# Patient Record
Sex: Male | Born: 1965 | Race: White | Hispanic: No | Marital: Single | Smoking: Never smoker
Health system: Southern US, Community
[De-identification: ages and names within clinical notes are randomized; demographics above are authoritative.]

---

## 1998-02-20 ENCOUNTER — Ambulatory Visit (HOSPITAL_BASED_OUTPATIENT_CLINIC_OR_DEPARTMENT_OTHER): Admission: RE | Admit: 1998-02-20 | Discharge: 1998-02-20 | Payer: Self-pay | Admitting: Otolaryngology

## 1999-01-02 ENCOUNTER — Other Ambulatory Visit: Admission: RE | Admit: 1999-01-02 | Discharge: 1999-01-02 | Payer: Self-pay | Admitting: Otolaryngology

## 2001-09-14 ENCOUNTER — Encounter: Payer: Self-pay | Admitting: Orthopedic Surgery

## 2001-09-14 ENCOUNTER — Ambulatory Visit: Admission: RE | Admit: 2001-09-14 | Discharge: 2001-09-14 | Payer: Self-pay | Admitting: Orthopedic Surgery

## 2001-12-19 ENCOUNTER — Encounter: Admission: RE | Admit: 2001-12-19 | Discharge: 2001-12-19 | Payer: Self-pay | Admitting: Internal Medicine

## 2001-12-19 ENCOUNTER — Encounter: Payer: Self-pay | Admitting: Internal Medicine

## 2002-04-25 ENCOUNTER — Encounter (INDEPENDENT_AMBULATORY_CARE_PROVIDER_SITE_OTHER): Payer: Self-pay | Admitting: Specialist

## 2002-04-25 ENCOUNTER — Observation Stay (HOSPITAL_COMMUNITY): Admission: EM | Admit: 2002-04-25 | Discharge: 2002-04-25 | Payer: Self-pay | Admitting: Emergency Medicine

## 2002-07-25 ENCOUNTER — Ambulatory Visit (HOSPITAL_COMMUNITY): Admission: RE | Admit: 2002-07-25 | Discharge: 2002-07-25 | Payer: Self-pay | Admitting: *Deleted

## 2002-09-17 ENCOUNTER — Encounter: Payer: Self-pay | Admitting: Internal Medicine

## 2002-09-17 ENCOUNTER — Encounter: Admission: RE | Admit: 2002-09-17 | Discharge: 2002-09-17 | Payer: Self-pay | Admitting: Internal Medicine

## 2005-07-06 ENCOUNTER — Ambulatory Visit (HOSPITAL_COMMUNITY): Admission: RE | Admit: 2005-07-06 | Discharge: 2005-07-06 | Payer: Self-pay | Admitting: Cardiology

## 2007-08-15 ENCOUNTER — Encounter: Admission: RE | Admit: 2007-08-15 | Discharge: 2007-08-15 | Payer: Self-pay | Admitting: Cardiology

## 2011-02-12 NOTE — H&P (Signed)
Bradley Reilly, Bradley Reilly                      ACCOUNT NO.:  [id number]   MEDICAL RECORD NO.:  [id number]                   PATIENT TYPE:  INP   LOCATION:  0345                                 FACILITY:  [facility]   PHYSICIAN:  Althea Grimmer. Luther Parody, M.D.            DATE OF BIRTH:  Feb 03, 1966   DATE OF ADMISSION:  04/25/2002  DATE OF DISCHARGE:                                HISTORY & PHYSICAL   HISTORY:  The patient is a 45 year old male with a history of hepatitis C.  He was about to begin therapy with interferon and ribavirin later this week.  He presented earlier this morning to the emergency room having had several  episodes of hematemesis with syncope.  By his admission, he has been  drinking at least two drinks of hard liquor nightly and he has been under a  lot of stress.  He had an upset stomach and vomited bright red blood  yesterday and this morning.  He did not have any melena and he does not seem  to have any abdominal pain at this time.  He has never had a problem like  this before.  He is not taking nonsteroidals or aspirin.   CURRENT MEDICATIONS:  None.   ALLERGIES:  None.   PAST MEDICAL HISTORY:  Pertinent for multiple surgeries on nose and back  surgery with Harrington rod placement for a fractured T-11 vertebra.   FAMILY HISTORY:  Negative for ulcers, gallstones, inflammatory bowel  disease, colorectal neoplasia or liver problems.   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  He is single but lives with a male.  He does not smoke.  In general  he drinks two to five drinks per week but his girlfriend insists that it is  more.   REVIEW OF SYSTEMS:  No weight loss or night sweats.  ENDOCRINE:  No history  of diabetes or thyroid problems.  SKIN:  No rash or pruritus.  EYES:  No  icterus or change in vision.  ENT:  No aphthous ulcerations or chronic sore  throat.  RESPIRATORY:  No shortness of breath, coughing or wheezing.  CARDIAC:  No chest pain, complications or history of valvular heart disease.  GI:  As above.  GU:  No dysuria or hematuria.   PHYSICAL EXAMINATION:  GENERAL:  He is a well-developed muscular young adult  male in no acute distress.  Afebrile.  Blood pressure 130/88, pulse 90 and  regular.  SKIN:  Normal.  HEENT:  Eyes are anicteric.  Oropharynx is unremarkable without aphthous  ulcerations.  No significant cervical adenopathy.  Neck supple without  thyromegaly.  CHEST:  Clear to auscultation and percussion.  ABDOMEN:  Muscular without mass, tenderness or organomegaly.  No  hepatomegaly.  EXTREMITIES:  Without cyanosis, clubbing edema or rash.  GU:  No inguinal adenopathy.   LABORATORY DATA:  Hemoglobin 11.7, which has fallen  from his baseline of  13.5.  Electrolytes are normal.  Amylase and lipase normal.   IMPRESSION:  Acute upper gastrointestinal bleeding.  Rule out peptic  disease, ulcer or Mallory-Weiss tear.  I do not think he has significant  enough liver disease to develop varices.   PLAN:  Urgent endoscopy is scheduled.  Until this is accomplished, he will  be on Protonix drip and kept NPO.  Please see the orders.   ADDENDUM:  Upper endoscopy revealed esophagitis with suggestion of Barrett's  that was biopsied.  In addition, he had a nonbleeding Mallory-Weiss tear and  no other findings.  Since he has had no vomiting since this morning and his  hemoglobin is 11.7, he will be discharged home for evaluation in the office.                                                Althea Grimmer. Luther Parody, M.D.    PJS/MEDQ  D:  04/25/2002  T:  04/30/2002  Job:  [id number]

## 2011-02-12 NOTE — Op Note (Signed)
[facility]  Patient:    Bradley Reilly, Bradley Reilly Visit Number: [id number] MRN: [id number]          Service Type: SUR Location: 4W [id number] 01 Attending Physician:  Dominica Severin Dictated by:   Elisha Ponder, M.D. Proc. Date: 09/15/01 Admit Date:  09/14/2001 Discharge Date: 09/14/2001   CC:         Elisha Ponder, M.D.   Operative Report  DATE OF BIRTH:  27-Oct-1965.  PREOPERATIVE DIAGNOSIS:  Right distal biceps tendon complete rupture.  POSTOPERATIVE DIAGNOSIS:  Right distal biceps tendon complete rupture.  PROCEDURE: 1. Right distal biceps reattachment at the radial tuberosity with Endobutton    technique. 2. Stress radiography, right elbow.  SURGEON:  Elisha Ponder, M.D.  ASSISTANT:  Ottie Glazier. Wynona Neat, P.A.-C.  COMPLICATIONS:  None.  ANESTHESIA:  General.  TOURNIQUET TIME:  Less than two hours.  ESTIMATED BLOOD LOSS:  Minimal.  DRAINS:  None.  INDICATIONS:  This patient is a 45 year old white male who presents with a distal biceps rupture. This was sustained approximately two weeks ago. The patient has significant supination and flexion strength loss. I have discussed with him the diagnosis and its treatment. I have discussed with him that typically supination endurance as well as supination strength will be decreased markedly and that there is a portion of flexion strength loss and endurance loss. We have discussed these issues, alternatives of treatment, conservative and surgical, etc. With all this in mind, he desired to proceed. I have discussed with him the risk of bleeding, infection, anesthesia, damage to normal structures, and failure of surgery to accomplish intended goals of relieving symptoms and restoring function. With this is mind, he desired to proceed.  OPERATIVE FINDINGS:  This patient had a complete distal biceps tendon rupture from the radial tuberosity. He underwent reattachment without difficulty as described  below. He had good, sturdy _______ fixation.  PROCEDURE IN DETAIL:  The patient was seen by myself and anesthesia. He was taken to the operative suite and underwent prophylactic antibiotic administration and then was laid supine and appropriately padded and prepped and draped in the usual sterile fashion with Betadine scrub and paint after a smooth induction of general anesthesia under the direction of Dr. Sherrian Divers. Once this was done, the arm was elevated and tourniquet was insufflated to 270 mmHg and incision was made beginning at the antecubital crease and extending distally midline in the forearm. The length of the incision was 3 to 4 inches total. Dissection was carried through the skin with knife blade. Following this, blunt dissection was carried out until the fascia was identified and interval between the brachial radialis and pronator was created. Median nerve and brachial artery were palpated and kept out of harms way. The biceps tendon was identified and mobilized. It was completely torn and retracted somewhat but held by the lacertus fibrosis. I did not take down the entire lacertus fibrosis during the operative procedure. Once the tendon was mobilized, I cut it to a fresh end and then placed a #5 Ethibond suture in it in a Krackow fashion and placed an Endobutton technique, according to protocol, around the distal end allowing 3 to 5 mm between the freshened tendon end and the Endobutton. I put two kite-string sutures on either side of the Endobutton. Once this was done, access to the tuberosity of the radius was gained and with a burr I then burred an area of cancellous bone that would accept the Endobutton. Once this was  done, I opened this into a small crater-like area using irrigation to prevent any bone debris deposits in the musculature. Retractors were placed accordingly and care was taken to avoid any injury to the posterior interosseous nerve by keeping the arm  supinated and keeping in mind its location at all times. The patient then had the Beath pin drilled through the tunnel and crater prepared by the burr. The Beath pin was drilled through the opposite cortex. Once the opposite cortex was drilled through, I then tapped the Beath pin atraumatically which exited just adjacent to the ulna. This was allowed to exit distally. Following this, the cannulated drill was used to drill the region through the Beath pin to allow the Endobutton passage onto the opposite cortex later in the case. Once this was drilled, it was then withdrawn. Following this, the kite-string sutures previously placed on the Endobutton were placed through the eyelet of the Beath pin. The Beath pin was then pulled through the small area adjacent to the ulna on the opposite side of the incision. The two kite-string sutures then allowed for the Endobutton to engage on the opposite side of the cortex. This was done under stress radiography and fluoroscopy control. The Endobutton was pulled through with the leading suture followed by flipping the Endobutton with the opposite kite-string suture. It then laid against the cortex nicely, thus securing the biceps tendon with Endobutton technique as described per standard protocol. I was very happy with this and then checked the wound. The patient had excellent position of the biceps tendon stuffed into the previously prepared bed and crater for it. There was no instability about the elbow as it was stress tested and I was very pleased with the biceps repair. Copious irrigation was applied, the tourniquet was deflated, and hemostasis was noted to be adequate. The patient did not have any bulging compartments that were tight or evidence of acute carpal tunnel syndrome or compartment syndrome in my opinion. He then had the wound closed with interrupted suture of the Prolene variety. I should note that the lateral antebrachial  cutaneous nerve was identified and protected at all times throughout the case. The  patient tolerated the procedure well without difficulty. At the conclusion of the case, he was placed in a long arm dressing and a posterior plaster splint was applied. He tolerated this well. He was taken to the recovery room in stable condition where he was able to demonstrate competency with finger extension demonstrating the posterior interosseous nerve to be intact. He will ice and elevate and be allowed to be discharged from the hospital. He will return to see Dr. Amanda Pea in the office in seven days and we will begin a distal biceps Endobutton repair protocol. I will allow him the advanced protocol as this was a very competent repair.  He was discharged home on Percocet, Robaxin, and Phenergan as well as three days of Keflex. He will notify if any problems occur. All questions have been encouraged and answered. Dictated by:   Elisha Ponder, M.D. Attending Physician:  Dominica Severin DD:  09/15/01 TD:  09/16/01 Job: [id number]

## 2020-01-29 ENCOUNTER — Encounter: Payer: Self-pay | Admitting: *Deleted

## 2020-01-29 ENCOUNTER — Emergency Department
Admission: EM | Admit: 2020-01-29 | Discharge: 2020-01-29 | Disposition: A | Discharge status: Home / Self Care | Payer: Self-pay | Attending: Emergency Medicine | Admitting: Emergency Medicine

## 2020-01-29 ENCOUNTER — Emergency Department: Payer: Self-pay

## 2020-01-29 ENCOUNTER — Other Ambulatory Visit: Payer: Self-pay

## 2020-01-29 DIAGNOSIS — Y92009 Unspecified place in unspecified non-institutional (private) residence as the place of occurrence of the external cause: Secondary | ICD-10-CM | POA: Insufficient documentation

## 2020-01-29 DIAGNOSIS — W010XXA Fall on same level from slipping, tripping and stumbling without subsequent striking against object, initial encounter: Secondary | ICD-10-CM | POA: Insufficient documentation

## 2020-01-29 DIAGNOSIS — S40011A Contusion of right shoulder, initial encounter: Secondary | ICD-10-CM | POA: Insufficient documentation

## 2020-01-29 DIAGNOSIS — Y9301 Activity, walking, marching and hiking: Secondary | ICD-10-CM | POA: Insufficient documentation

## 2020-01-29 DIAGNOSIS — Y999 Unspecified external cause status: Secondary | ICD-10-CM | POA: Insufficient documentation

## 2020-01-29 MED ORDER — RIVAROXABAN 15 MG PO TABS
15.0000 mg | ORAL_TABLET | Freq: Once | ORAL | Status: AC
  Administered 2020-01-29: 15 mg via ORAL
  Filled 2020-01-29: qty 1

## 2020-01-29 MED ORDER — MELOXICAM 15 MG PO TABS: 15.0000 mg | ORAL_TABLET | Freq: Every day | ORAL | 0 refills | Status: AC

## 2020-01-29 MED ORDER — OXYCODONE-ACETAMINOPHEN 5-325 MG PO TABS: 1.0000 | ORAL_TABLET | Freq: Once | ORAL | Status: DC

## 2020-01-29 MED ORDER — HYDROCODONE-ACETAMINOPHEN 5-325 MG PO TABS
1.0000 | ORAL_TABLET | Freq: Once | ORAL | Status: AC
  Administered 2020-01-29: 22:00:00 1 via ORAL
  Filled 2020-01-29: qty 1

## 2020-01-29 MED ORDER — ZOLPIDEM TARTRATE 5 MG PO TABS
5.0000 mg | ORAL_TABLET | Freq: Every evening | ORAL | Status: DC | PRN
  Administered 2020-01-29: 5 mg via ORAL
  Filled 2020-01-29: qty 1

## 2020-01-29 NOTE — ED Notes (Signed)
Pt given food tray and drink with Christiane Ha PA's verbal okay.

## 2020-01-29 NOTE — ED Notes (Signed)
Christiane Ha PA at bedside. Pt verbal and ambulatory. Pt able to move shoulder to same degree as before tripping and falling today per pt. Pt requesting pain meds and food/drink.

## 2020-01-29 NOTE — ED Notes (Signed)
Pt reports itchiness with consumption of percocet. Asked to have med switched to vicodin. Christiane Ha PA notified.

## 2020-01-29 NOTE — ED Provider Notes (Signed)
[facility] Emergency Department Provider Note  ____________________________________________  Time seen: Approximately 10:20 PM  I have reviewed the triage vital signs and the nursing notes.   HISTORY  Chief Complaint Shoulder Pain    HPI Bradley Reilly is a 54 y.o. male who presents the emergency department complaining of right shoulder pain.  Patient states that he was walking in his house, tripped over his dog and landed on her shoulder.  Patient had rotator cuff surgery 7 weeks ago was concerned he may have injured his shoulder further.  Patient did land on his shoulder but it was tucked to his body.  Patient has no loss of range of motion.  Patient is complaining of shoulder pain but no other injury.  He did not hit his head or lose consciousness.  No back pain.  No radicular symptoms in the upper or lower extremity.         History reviewed. No pertinent past medical history.  There are no problems to display for this patient.   History reviewed. No pertinent surgical history.  Prior to Admission medications   Medication Sig Start Date End Date Taking? Authorizing Provider  meloxicam (MOBIC) 15 MG tablet Take 1 tablet (15 mg total) by mouth daily. 01/29/20   Jourdan Maldonado, Charline Bills, PA-C    Allergies Patient has no known allergies.  History reviewed. No pertinent family history.  Social History Social History   Tobacco Use  . Smoking status: Not on file  . Smokeless tobacco: Never Used  Substance Use Topics  . Alcohol use: Yes    Comment: socially  . Drug use: Not on file     Review of Systems  Constitutional: No fever/chills Eyes: No visual changes. No discharge ENT: No upper respiratory complaints. Cardiovascular: no chest pain. Respiratory: no cough. No SOB. Gastrointestinal: No abdominal pain.  No nausea, no vomiting.  No diarrhea.  No constipation. Musculoskeletal: Positive for right shoulder injury. Skin: Negative for  rash, abrasions, lacerations, ecchymosis. Neurological: Negative for headaches, focal weakness or numbness. 10-point ROS otherwise negative.  ____________________________________________   PHYSICAL EXAM:  VITAL SIGNS: ED Triage Vitals  Enc Vitals Group     BP 01/29/20 2049 (!) 134/98     Pulse Rate 01/29/20 2049 96     Resp 01/29/20 2049 16     Temp 01/29/20 2049 98.7 F (37.1 C)     Temp Source 01/29/20 2049 Oral     SpO2 01/29/20 2049 97 %     Weight 01/29/20 2055 216 lb (98 kg)     Height 01/29/20 2055 5\' 10"  (1.778 m)     Head Circumference --      Peak Flow --      Pain Score 01/29/20 2055 9     Pain Loc --      Pain Edu? --      Excl. in [place]? --      Constitutional: Alert and oriented. Well appearing and in no acute distress. Eyes: Conjunctivae are normal. PERRL. EOMI. Head: Atraumatic. ENT:      Ears:       Nose: No congestion/rhinnorhea.      Mouth/Throat: Mucous membranes are moist.  Neck: No stridor.  No cervical spine tenderness to palpation.  Cardiovascular: Normal rate, regular rhythm. Normal S1 and S2.  Good peripheral circulation. Respiratory: Normal respiratory effort without tachypnea or retractions. Lungs CTAB. Good air entry to the bases with no decreased or absent breath sounds. Musculoskeletal: Full range of motion  to all extremities. No gross deformities appreciated.  Patient does have decreased range of motion however this is patient's baseline following his rotator cuff surgery.  Patient states that he has no decrease from his postsurgical range of motion.  Patient is tender to palpation along the acromioclavicular joint space with no other tenderness.  No palpable abnormality or deficit.  Examination of the cervical spine, elbow, wrist is unremarkable.  Radial pulses sensation intact distally. Neurologic:  Normal speech and language. No gross focal neurologic deficits are appreciated.  Skin:  Skin is warm, dry and intact. No rash noted. Psychiatric:  Mood and affect are normal. Speech and behavior are normal. Patient exhibits appropriate insight and judgement.   ____________________________________________   LABS (all labs ordered are listed, but only abnormal results are displayed)  Labs Reviewed - No data to display ____________________________________________  EKG   ____________________________________________  RADIOLOGY I personally viewed and evaluated these images as part of my medical decision making, as well as reviewing the written report by the radiologist.  DG Shoulder Right  Result Date: 01/29/2020 CLINICAL DATA:  Larey Seat, right shoulder pain, recent right rotator cuff surgery 7 weeks ago EXAM: RIGHT SHOULDER - 2+ VIEW COMPARISON:  08/15/2007 FINDINGS: Internal rotation, external rotation, transscapular views of the right shoulder are obtained. Previous resection and distal aspect right clavicle is noted, with well corticated ossific densities either representing chronic posttraumatic change or prior postsurgical change. There are no acute displaced fractures. Mild irregularity of the superolateral aspect of the humeral head may reflect prior Hill-Sachs deformity. There is mild glenohumeral joint space narrowing and osteophyte formation. Right chest is clear. IMPRESSION: 1. Chronic posttraumatic and postsurgical changes as above. No acute displaced fracture. Electronically Signed   By: Sharlet Salina M.D.   On: 01/29/2020 22:08    ____________________________________________    PROCEDURES  Procedure(s) performed:    Procedures    Medications  zolpidem (AMBIEN) tablet 5 mg (5 mg Oral Given 01/29/20 2237)  HYDROcodone-acetaminophen (NORCO/VICODIN) 5-325 MG per tablet 1 tablet (1 tablet Oral Given 01/29/20 2145)  Rivaroxaban (XARELTO) tablet 15 mg (15 mg Oral Given 01/29/20 2237)     ____________________________________________   INITIAL IMPRESSION / ASSESSMENT AND PLAN / ED COURSE  Pertinent labs & imaging results  that were available during my care of the patient were reviewed by me and considered in my medical decision making (see chart for details).  Review of the [place] CSRS was performed in accordance of the [facility] prior to dispensing any controlled drugs.           Patient's diagnosis is consistent with shoulder contusion.  Patient presented to emergency department complaining of right shoulder pain after tripping and falling on her shoulder.  Patient is 7 weeks post rotator cuff surgery.  No loss of range of motion from his postsurgical range of motion.  X-ray reveals no acute osseous abnormality.  I will place the patient on anti-inflammatories and advised him to follow-up with his orthopedic surgeon if he has increased pain, symptoms or decreased range of motion.  Patient verbalizes understanding of same..  Patient is given ED precautions to return to the ED for any worsening or new symptoms.     ____________________________________________  FINAL CLINICAL IMPRESSION(S) / ED DIAGNOSES  Final diagnoses:  Contusion of right shoulder, initial encounter      NEW MEDICATIONS STARTED DURING THIS VISIT:  ED Discharge Orders         Ordered    meloxicam (MOBIC) 15 MG tablet  Daily     01/29/20 2228              This chart was dictated using voice recognition software/Dragon. Despite best efforts to proofread, errors can occur which can change the meaning. Any change was purely unintentional.    Racheal Patches, PA-C 01/29/20 2339    Sharyn Creamer, MD 01/30/20 0000

## 2020-01-29 NOTE — ED Triage Notes (Signed)
Pt to ED reporting a new injury to his right shoulder after tripping over his dog tonight. Pt is 7 weeks out from rotator cuff surgery. Pt is holding right arm but no obvious deformity to shoulder at this time.    Pt asked this RN to "write this down" so in case patient asks for information again : Bradley Reilly at [employer] - where is my dog

## 2020-01-31 ENCOUNTER — Emergency Department: Payer: Self-pay

## 2020-01-31 ENCOUNTER — Emergency Department
Admission: EM | Admit: 2020-01-31 | Discharge: 2020-01-31 | Discharge status: Against Medical Advice | Payer: Self-pay | Attending: Emergency Medicine | Admitting: Emergency Medicine

## 2020-01-31 ENCOUNTER — Other Ambulatory Visit: Payer: Self-pay

## 2020-01-31 DIAGNOSIS — S82891A Other fracture of right lower leg, initial encounter for closed fracture: Secondary | ICD-10-CM | POA: Insufficient documentation

## 2020-01-31 DIAGNOSIS — Y939 Activity, unspecified: Secondary | ICD-10-CM | POA: Insufficient documentation

## 2020-01-31 DIAGNOSIS — W19XXXA Unspecified fall, initial encounter: Secondary | ICD-10-CM | POA: Insufficient documentation

## 2020-01-31 DIAGNOSIS — Y929 Unspecified place or not applicable: Secondary | ICD-10-CM | POA: Insufficient documentation

## 2020-01-31 DIAGNOSIS — L03115 Cellulitis of right lower limb: Secondary | ICD-10-CM | POA: Insufficient documentation

## 2020-01-31 DIAGNOSIS — Y999 Unspecified external cause status: Secondary | ICD-10-CM | POA: Insufficient documentation

## 2020-01-31 DIAGNOSIS — Z5320 Procedure and treatment not carried out because of patient's decision for unspecified reasons: Secondary | ICD-10-CM | POA: Insufficient documentation

## 2020-01-31 LAB — CBC WITH DIFFERENTIAL/PLATELET
Abs Immature Granulocytes: 0.03 10*3/uL (ref 0.00–0.07)
Basophils Absolute: 0 10*3/uL (ref 0.0–0.1)
Basophils Relative: 0 %
Eosinophils Absolute: 0.1 10*3/uL (ref 0.0–0.5)
Eosinophils Relative: 2 %
HCT: 33 % — ABNORMAL LOW (ref 39.0–52.0)
Hemoglobin: 11 g/dL — ABNORMAL LOW (ref 13.0–17.0)
Immature Granulocytes: 0 %
Lymphocytes Relative: 22 %
Lymphs Abs: 1.9 10*3/uL (ref 0.7–4.0)
MCH: 28.4 pg (ref 26.0–34.0)
MCHC: 33.3 g/dL (ref 30.0–36.0)
MCV: 85.1 fL (ref 80.0–100.0)
Monocytes Absolute: 0.6 10*3/uL (ref 0.1–1.0)
Monocytes Relative: 6 %
Neutro Abs: 6.1 10*3/uL (ref 1.7–7.7)
Neutrophils Relative %: 70 %
Platelets: 185 10*3/uL (ref 150–400)
RBC: 3.88 MIL/uL — ABNORMAL LOW (ref 4.22–5.81)
RDW: 16.8 % — ABNORMAL HIGH (ref 11.5–15.5)
WBC: 8.8 10*3/uL (ref 4.0–10.5)
nRBC: 0 % (ref 0.0–0.2)

## 2020-01-31 LAB — COMPREHENSIVE METABOLIC PANEL
ALT: 18 U/L (ref 0–44)
AST: 25 U/L (ref 15–41)
Albumin: 4.1 g/dL (ref 3.5–5.0)
Alkaline Phosphatase: 66 U/L (ref 38–126)
Anion gap: 12 (ref 5–15)
BUN: 10 mg/dL (ref 6–20)
CO2: 24 mmol/L (ref 22–32)
Calcium: 9 mg/dL (ref 8.9–10.3)
Chloride: 104 mmol/L (ref 98–111)
Creatinine, Ser: 0.81 mg/dL (ref 0.61–1.24)
GFR calc Af Amer: >60 mL/min (ref >60)
GFR calc non Af Amer: >60 mL/min (ref >60)
Glucose, Bld: 103 mg/dL — ABNORMAL HIGH (ref 70–99)
Potassium: 3.6 mmol/L (ref 3.5–5.1)
Sodium: 140 mmol/L (ref 135–145)
Total Bilirubin: 0.7 mg/dL (ref 0.3–1.2)
Total Protein: 7.5 g/dL (ref 6.5–8.1)

## 2020-01-31 LAB — SEDIMENTATION RATE: Sed Rate: 44 mm/hr — ABNORMAL HIGH (ref 0–20)

## 2020-01-31 MED ORDER — KETOROLAC TROMETHAMINE 30 MG/ML IJ SOLN
30.0000 mg | Freq: Once | INTRAMUSCULAR | Status: AC
  Administered 2020-01-31: 20:00:00 30 mg via INTRAVENOUS
  Filled 2020-01-31: qty 1

## 2020-01-31 NOTE — ED Triage Notes (Signed)
Pt with c/o falling over dog after recent shoulder surgery and needs pain medicine. Pt walking and talking in room, rambling and off and on crying. See other note.

## 2020-01-31 NOTE — ED Provider Notes (Signed)
[facility] Emergency Department Provider Note       Time seen: ----------------------------------------- 7:48 PM on 01/31/2020 -----------------------------------------   I have reviewed the triage vital signs and the nursing notes.  HISTORY   Chief Complaint Fall    HPI Bradley Reilly is a 54 y.o. male with no known past medical history who presents to the ED for multiple complaints.  Patient complains of falling over a dog and recent shoulder surgery and requesting Vicodin.  Patient also has right foot and ankle redness and swelling where he states his boot is rubbing up against it.  He denies fevers, chills or other complaints.  Is noted to be talking incessantly.  History reviewed. No pertinent past medical history.  There are no problems to display for this patient.   History reviewed. No pertinent surgical history.  Allergies Patient has no known allergies.  Social History Social History   Tobacco Use  . Smoking status: Never Smoker  . Smokeless tobacco: Never Used  Substance Use Topics  . Alcohol use: Yes    Comment: socially  . Drug use: Not on file    Review of Systems Constitutional: Negative for fever. Cardiovascular: Negative for chest pain. Respiratory: Negative for shortness of breath. Gastrointestinal: Negative for abdominal pain, vomiting and diarrhea. Musculoskeletal: Positive for right shoulder pain, right ankle pain and swelling Skin: Positive for right foot and ankle erythema Neurological: Negative for headaches, focal weakness or numbness. Psychiatric: Negative for suicidal or homicidal ideation  All systems negative/normal/unremarkable except as stated in the HPI  ____________________________________________   PHYSICAL EXAM:  VITAL SIGNS: ED Triage Vitals  Enc Vitals Group     BP 01/31/20 1900 (!) 151/102     Pulse Rate 01/31/20 1900 99     Resp 01/31/20 1900 (!) 22     Temp 01/31/20 1900 97.7 F  (36.5 C)     Temp Source 01/31/20 1900 Axillary     SpO2 01/31/20 1900 96 %     Weight 01/31/20 1856 240 lb (108.9 kg)     Height 01/31/20 1856 5\' 9"  (1.753 m)     Head Circumference --      Peak Flow --      Pain Score 01/31/20 1855 10     Pain Loc --      Pain Edu? --      Excl. in GC? --     Constitutional: Alert and oriented.  No distress Eyes: Conjunctivae are normal. Normal extraocular movements. Cardiovascular: Normal rate, regular rhythm. No murmurs, rubs, or gallops. Respiratory: Normal respiratory effort without tachypnea nor retractions. Breath sounds are clear and equal bilaterally. No wheezes/rales/rhonchi. Gastrointestinal: Soft and nontender. Normal bowel sounds Musculoskeletal: Pain with range of motion right shoulder, right ankle, right lower extremity edema Neurologic:  Normal speech and language. No gross focal neurologic deficits are appreciated.  Skin: Erythema with some induration of the right lower extremity around the foot and ankle.  There is an abrasion over the right ankle medially Psychiatric: Elevated mood ____________________________________________  ED COURSE:  As part of my medical decision making, I reviewed the following data within the electronic MEDICAL RECORD NUMBER History obtained from family if available, nursing notes, old chart and ekg, as well as notes from prior ED visits. Patient presented for shoulder pain and right leg pain and swelling, we will assess with labs and imaging as indicated at this time.   Procedures  Bradley Reilly was evaluated in Emergency Department on 01/31/2020 for  the symptoms described in the history of present illness. He was evaluated in the context of the global COVID-19 pandemic, which necessitated consideration that the patient might be at risk for infection with the SARS-CoV-2 virus that causes COVID-19. Institutional protocols and algorithms that pertain to the evaluation of patients at risk for COVID-19 are in  a state of rapid change based on information released by regulatory bodies including the CDC and federal and state organizations. These policies and algorithms were followed during the patient's care in the ED.  ____________________________________________   LABS (pertinent positives/negatives)  Labs Reviewed  CBC WITH DIFFERENTIAL/PLATELET - Abnormal; Notable for the following components:      Result Value   RBC 3.88 (*)    Hemoglobin 11.0 (*)    HCT 33.0 (*)    RDW 16.8 (*)    All other components within normal limits  COMPREHENSIVE METABOLIC PANEL - Abnormal; Notable for the following components:   Glucose, Bld 103 (*)    All other components within normal limits  SEDIMENTATION RATE - Abnormal; Notable for the following components:   Sed Rate 44 (*)    All other components within normal limits    RADIOLOGY Images were viewed by me  Right ankle x-ray, right foot x-ray  IMPRESSION: Negative. IMPRESSION: Possible acute avulsion injury off the tip of the lateral fibular malleolus.  ____________________________________________   DIFFERENTIAL DIAGNOSIS   Fall, contusion, fracture, cellulitis, osteomyelitis, sprain  FINAL ASSESSMENT AND PLAN  Cellulitis, possible avulsion fracture   Plan: The patient had presented for right shoulder pain as well as right ankle pain and swelling. Patient's labs did reveal some elevation in his sed rate although not to levels most concerning for osteomyelitis. Patient's imaging revealed a possible avulsion fracture of the tip of the fibula.  We placed him in an Ace wrap and were planning on putting him on preventative antibiotics with outpatient follow-up but he left AGAINST MEDICAL ADVICE   Laurence Aly, MD    Note: This note was generated in part or whole with voice recognition software. Voice recognition is usually quite accurate but there are transcription errors that can and very often do occur. I apologize for any  typographical errors that were not detected and corrected.     Earleen Newport, MD 01/31/20 2217

## 2020-01-31 NOTE — ED Notes (Signed)
Pt moved to beh quad room 21. Pt is asking for vicodin to aid with his right shoulder pain which is chronic and he fell and aggravated the pain. Pt has a swollen right ankle and foot which has a wound with possible necrotic site to the right medial ankle. Pt reports that he has been walking for 2 days since the cops let him go. Pt states he takes xarelto and lasix and will up his lasix when he needs but he has not been home in 2 days. Pt states he has a home and lives in [place]. Pt does not appear to be psychotic but rather odd in speech. Pt is a decent historian but has trouble staying on track. Dr Mayford Knife to bedside.

## 2020-01-31 NOTE — ED Notes (Signed)
Pt alternately rambling and joking, repeatedly denies wanting to harm himself or others. Pt answering questions with long rambling answers. Pt escorted to bathroom by security.

## 2020-01-31 NOTE — ED Notes (Addendum)
Pt belongings were put in two pt bags and include: sneakers, razor, phone charger, sweatshirt and various food items in a [employer] bag. A cell phone was found outside that could belong to him, if he is missing one, the phone can be found in the safe.

## 2020-01-31 NOTE — ED Notes (Signed)
Pt escorted by security to room 54, pt appears agitated and worried about his dog and states he needs vicodin, pt rambling and changing subjects from blisters on feet to people in his family. Weeping at times, upset about family not seeing him. Pt states everything hurts, he has had recent shoulder surgery. Pt states he was accused of exposing himself to someone, sheriff came this am. Pt states he lost his medication and his money. Pt not answering direct questions, pt rambling about people kicking in his door. Pt states he went to [facility] and has been walking for days. Pt states he wants medication. Pt

## 2020-01-31 NOTE — ED Notes (Signed)
ED tech caught pt lighting a cigarette in the room. Tech took the lighter and cigarettes away. Pt states he did not smoke, jut lit it and put it out. Pt is not a psych pt at this time as per provider

## 2020-01-31 NOTE — ED Notes (Addendum)
Pt does not want to stay. Provider notified. Provider states pt can leave, but to ACE wrap his right ankle. Pt refused to let me wrap it at this time, he states he wants to  Go home and shower. Pt provided with ACE wrap. Pt given back his lighter, cigarette and other personal belongings.  ER provider and charge nurse notified that pt is leaving. As per ED provider, pt will be leaving AMA

## 2020-08-27 DEATH — deceased

## 2021-06-22 IMAGING — DX DG ANKLE COMPLETE 3+V*R*
3 series · 3 of 3 positions shown · non-contrast
Comparison: None.

CLINICAL DATA: Ankle and foot pain and swelling

EXAM:
RIGHT ANKLE - COMPLETE 3+ VIEW

[ankle ap]
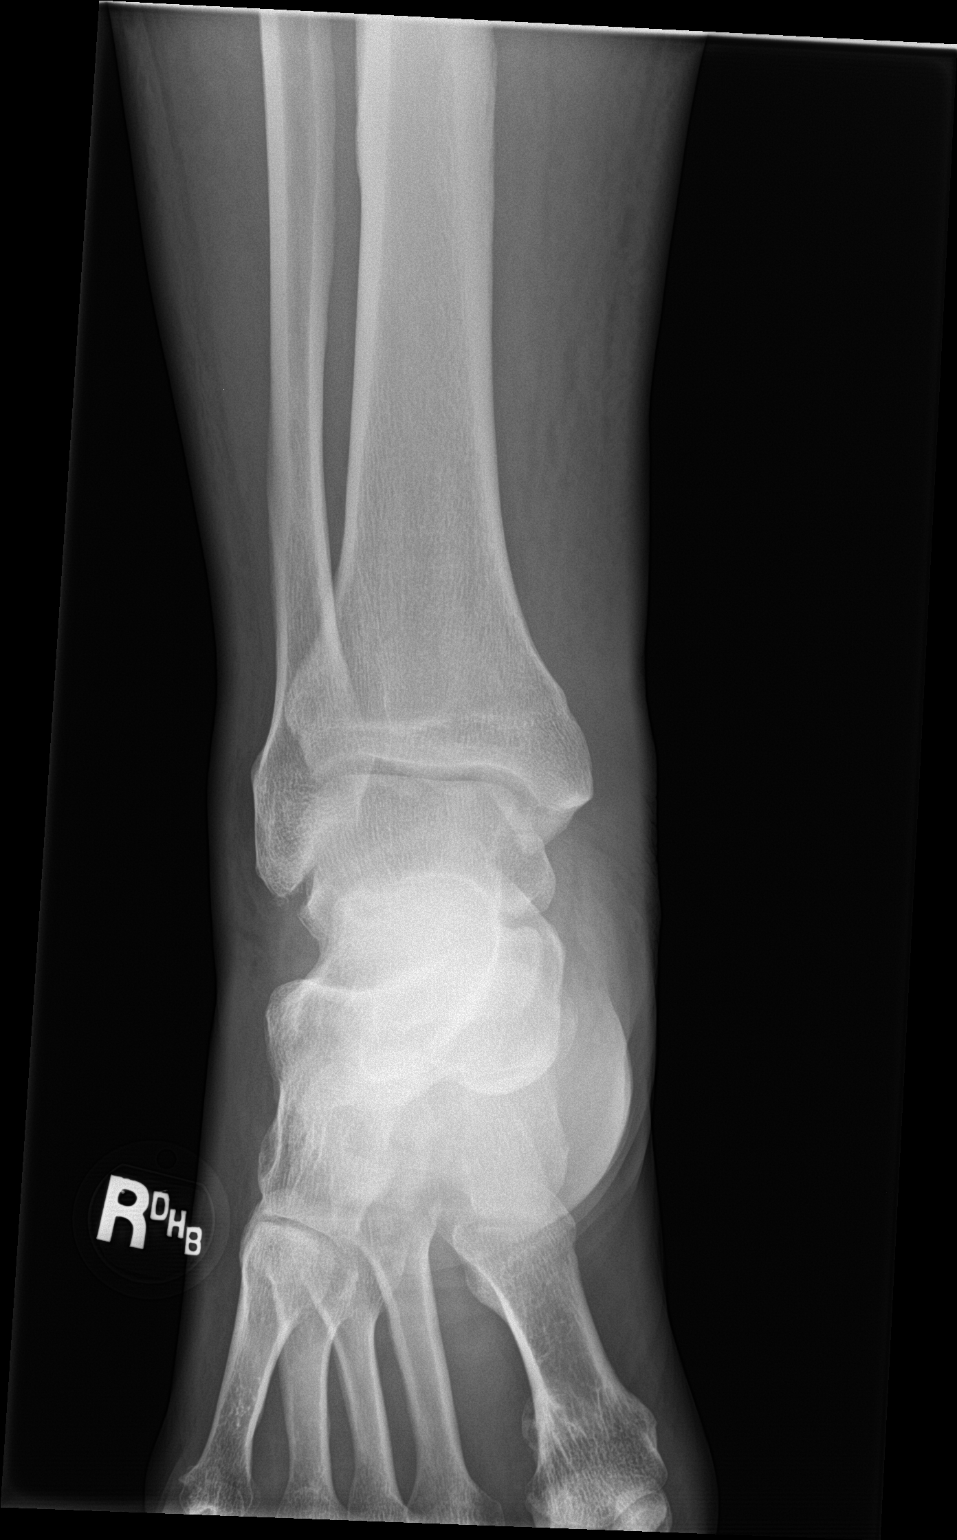

[ankle obl]
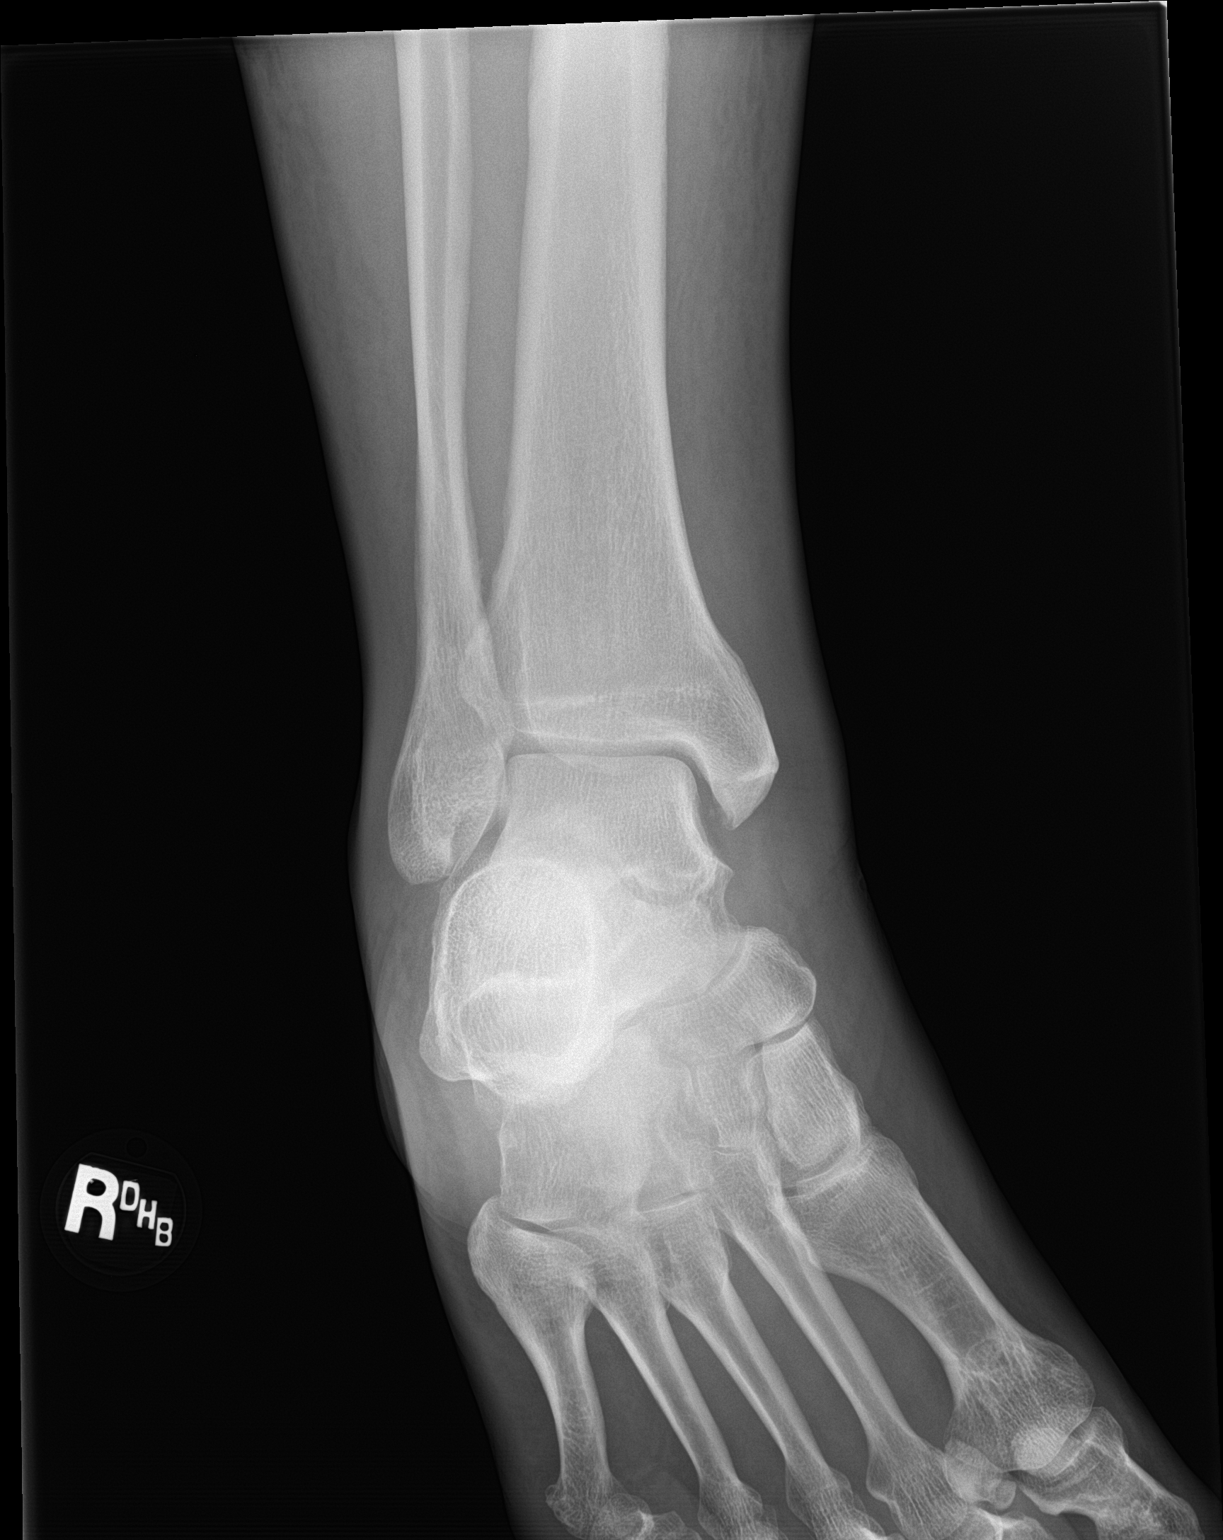

[ankle lat]
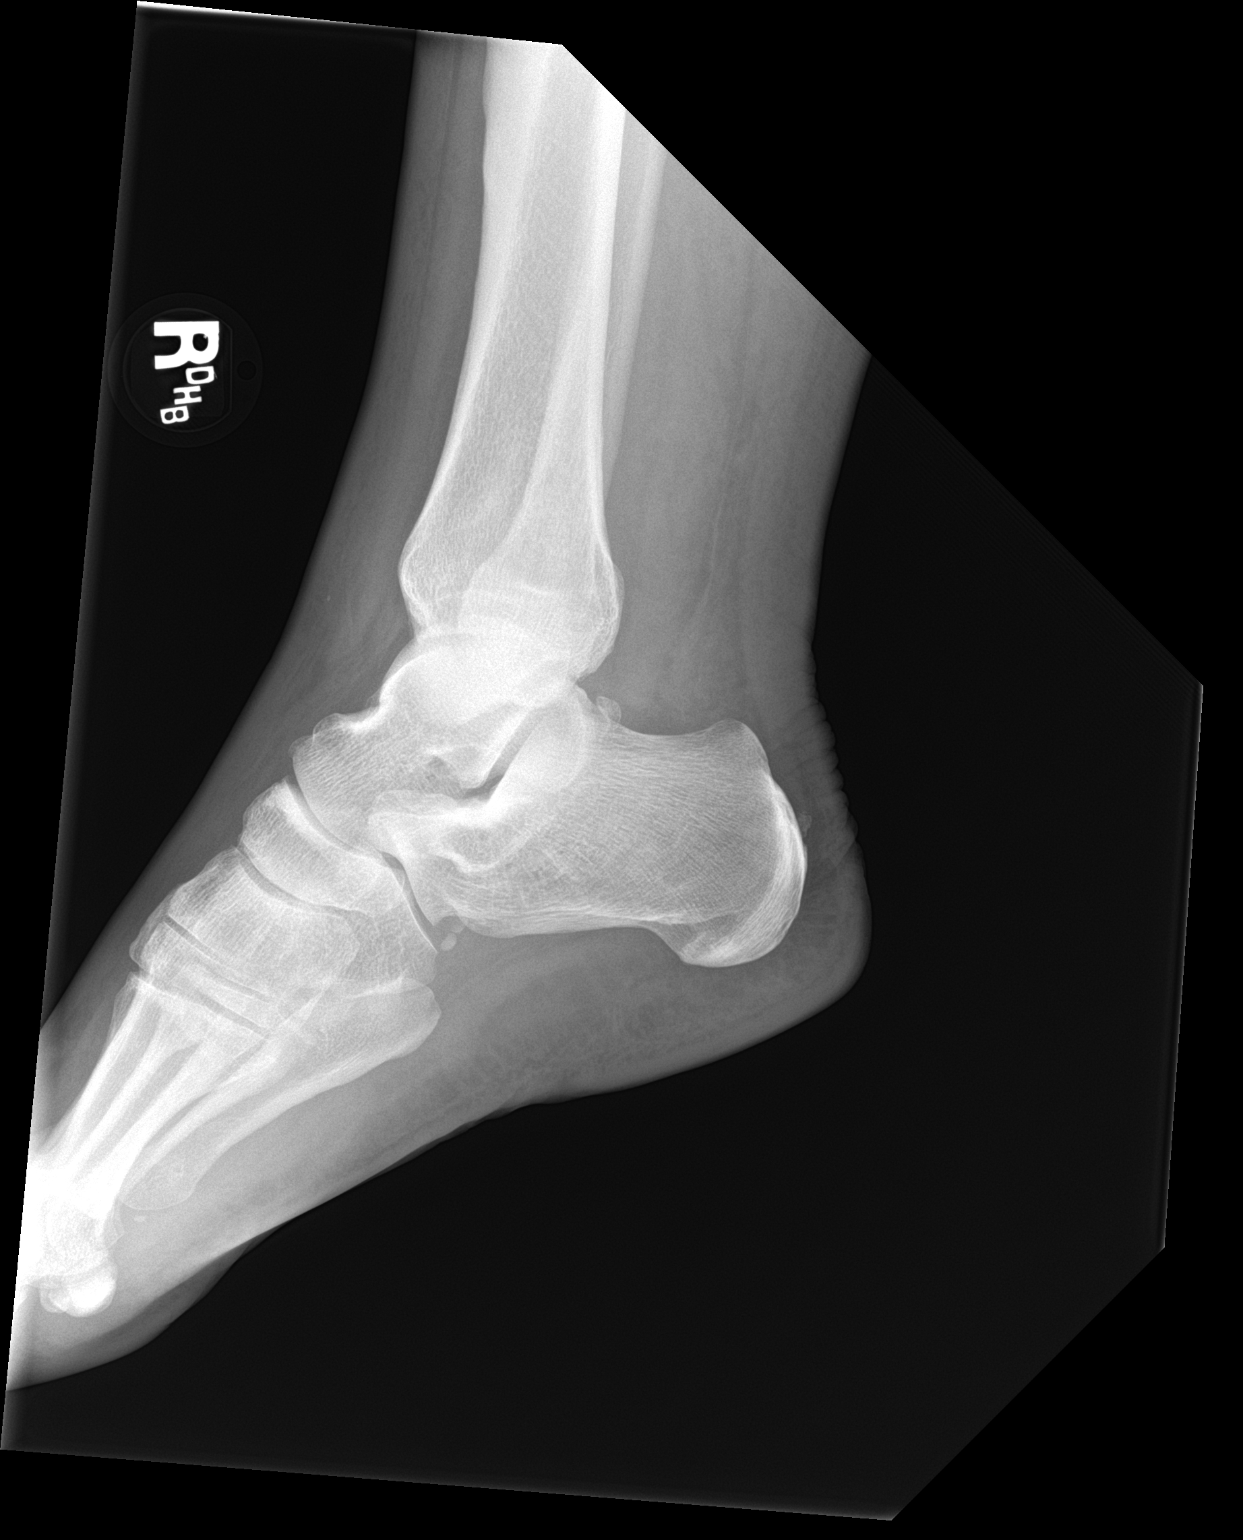

[3 of 3 positions shown; findings below may reference images not displayed]

FINDINGS: Possible acute avulsion injury off the tip of the lateral fibular
malleolus. Ankle mortise is symmetric. Soft tissue swelling is
present
IMPRESSION: Possible acute avulsion injury off the tip of the lateral fibular
malleolus.
